# Patient Record
Sex: Female | Born: 1960
Health system: Southern US, Community
[De-identification: ages and names within clinical notes are randomized; demographics above are authoritative.]

---

## 1999-12-08 ENCOUNTER — Encounter: Payer: Self-pay | Admitting: Family Medicine

## 1999-12-08 ENCOUNTER — Encounter: Admission: RE | Admit: 1999-12-08 | Discharge: 1999-12-08 | Payer: Self-pay | Admitting: Family Medicine

## 2008-03-27 ENCOUNTER — Encounter: Admission: RE | Admit: 2008-03-27 | Discharge: 2008-03-27 | Payer: Self-pay | Admitting: Family Medicine

## 2019-07-27 ENCOUNTER — Other Ambulatory Visit: Payer: Self-pay

## 2019-07-27 DIAGNOSIS — Z20822 Contact with and (suspected) exposure to covid-19: Secondary | ICD-10-CM

## 2019-07-28 LAB — NOVEL CORONAVIRUS, NAA: SARS-CoV-2, NAA: NOT DETECTED

## 2019-11-22 ENCOUNTER — Ambulatory Visit: Payer: Self-pay

## 2019-11-26 ENCOUNTER — Ambulatory Visit: Payer: Self-pay | Attending: Family

## 2019-11-26 DIAGNOSIS — Z23 Encounter for immunization: Secondary | ICD-10-CM | POA: Insufficient documentation

## 2019-11-26 NOTE — Progress Notes (Signed)
   Covid-19 Vaccination [facility]  Name:  Misty Roth    MRN: [id number] DOB: April 07, 1961  11/26/2019  [name] was observed post Covid-19 immunization for 15 minutes without incidence. She was provided with Vaccine Information Sheet and instruction to access the V-Safe system.   [name] was instructed to call 911 with any severe reactions post vaccine: Marland Kitchen Difficulty breathing  . Swelling of your face and throat  . A fast heartbeat  . A bad rash all over your body  . Dizziness and weakness    Immunizations Administered    Name Date Dose VIS Date Route   Moderna COVID-19 Vaccine 11/26/2019  4:30 PM 0.5 mL 09/04/2019 Intramuscular   Manufacturer: Moderna   Lot: [id number]   NDC: 45848-350-75

## 2020-01-08 ENCOUNTER — Ambulatory Visit: Payer: Self-pay | Attending: Internal Medicine

## 2020-01-08 DIAGNOSIS — Z23 Encounter for immunization: Secondary | ICD-10-CM

## 2020-01-08 NOTE — Progress Notes (Signed)
   Covid-19 Vaccination Clinic  Name:  Misty Roth    MRN: [id number] DOB: July 31, 1961  01/08/2020  [name] was observed post Covid-19 immunization for 15 minutes without incident. She was provided with Vaccine Information Sheet and instruction to access the V-Safe system.   [name] was instructed to call 911 with any severe reactions post vaccine: Marland Kitchen Difficulty breathing  . Swelling of face and throat  . A fast heartbeat  . A bad rash all over body  . Dizziness and weakness   Immunizations Administered    Name Date Dose VIS Date Route   Moderna COVID-19 Vaccine 01/08/2020  3:21 PM 0.5 mL 09/04/2019 Intramuscular   Manufacturer: Moderna   Lot: [id number]   NDC: 30940-768-08

## 2020-12-08 ENCOUNTER — Other Ambulatory Visit: Payer: Self-pay | Admitting: Nurse Practitioner

## 2020-12-08 DIAGNOSIS — F17200 Nicotine dependence, unspecified, uncomplicated: Secondary | ICD-10-CM

## 2020-12-23 ENCOUNTER — Ambulatory Visit
Admission: RE | Admit: 2020-12-23 | Discharge: 2020-12-23 | Disposition: A | Discharge status: Home / Self Care | Payer: BC Managed Care – PPO | Source: Ambulatory Visit | Attending: Nurse Practitioner | Admitting: Nurse Practitioner

## 2020-12-23 ENCOUNTER — Other Ambulatory Visit: Payer: Self-pay

## 2020-12-23 DIAGNOSIS — F17200 Nicotine dependence, unspecified, uncomplicated: Secondary | ICD-10-CM

## 2021-04-30 IMAGING — CT CT CHEST LUNG CANCER SCREENING LOW DOSE W/O CM
1 series · 15 of 31 positions shown, 19 images · non-contrast
Comparison: None.

CLINICAL DATA: Former smoker, quit 7 years ago, 25 pack-year
history.

EXAM:
CT CHEST WITHOUT CONTRAST LOW-DOSE FOR LUNG CANCER SCREENING
TECHNIQUE: Multidetector CT imaging of the chest was performed following the
standard protocol without IV contrast.

[Series 2: ldct screen -soft · axial · 0.86mm/px · z∈[-334,-44]mm · 15 of 64 slices shown, 19 images]
[im 3/64  mediastinal]
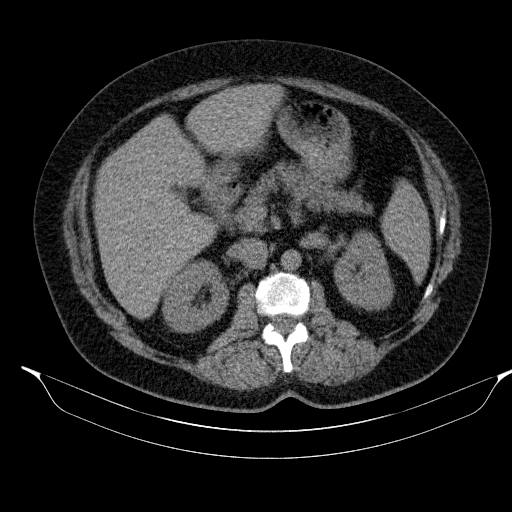
[im 3/64  lung]
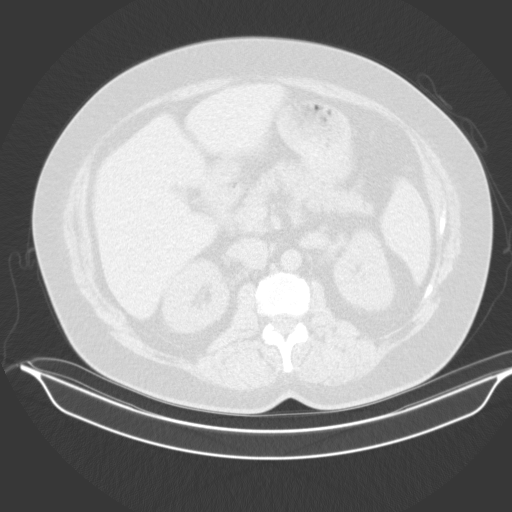
[im 8/64  lung]
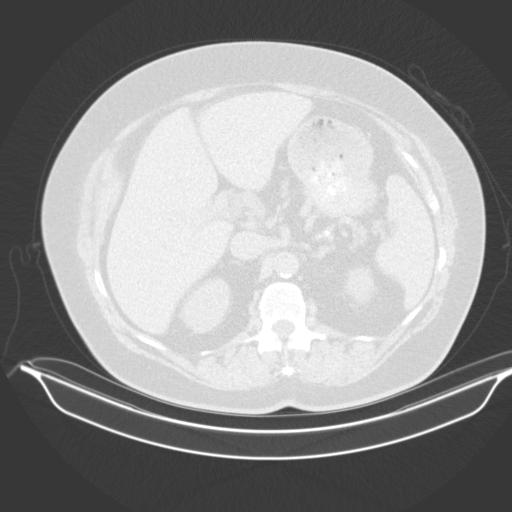
[im 12/64  lung]
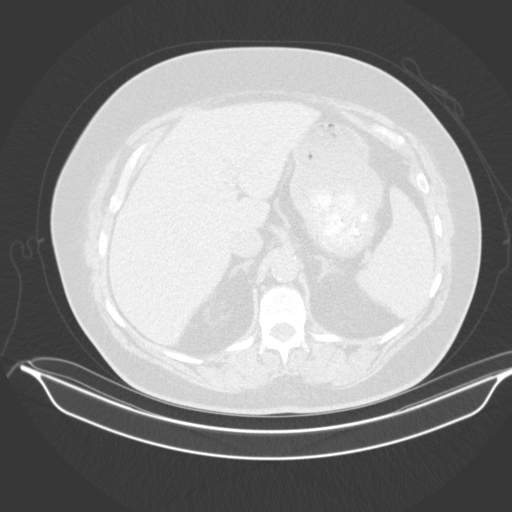
[im 15/64  lung]
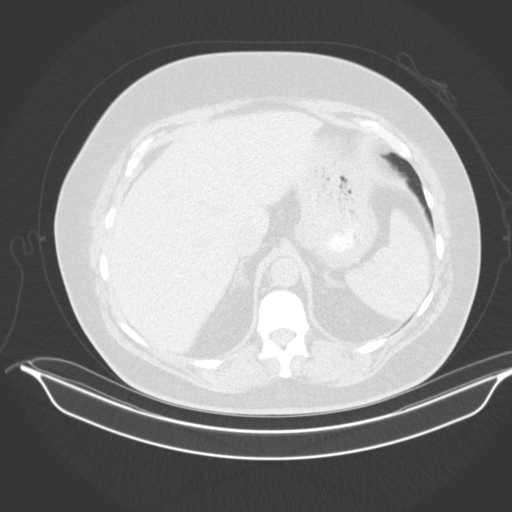
[im 19/64  mediastinal]
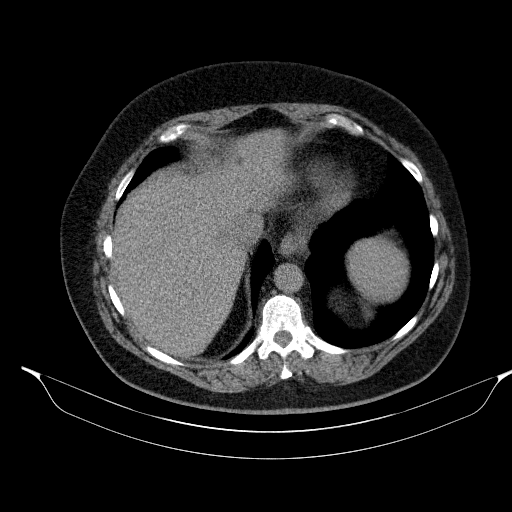
[im 19/64  lung]
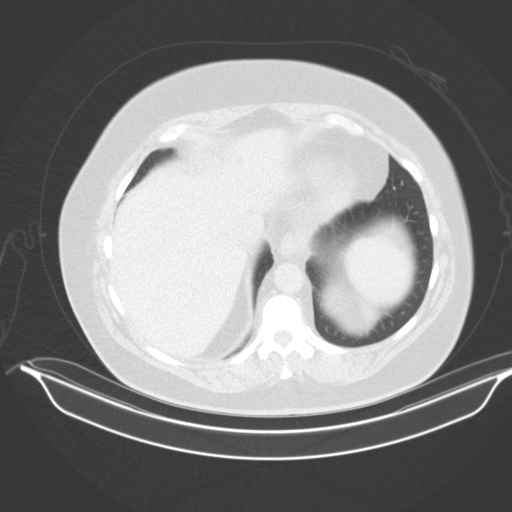
[im 24/64  lung]
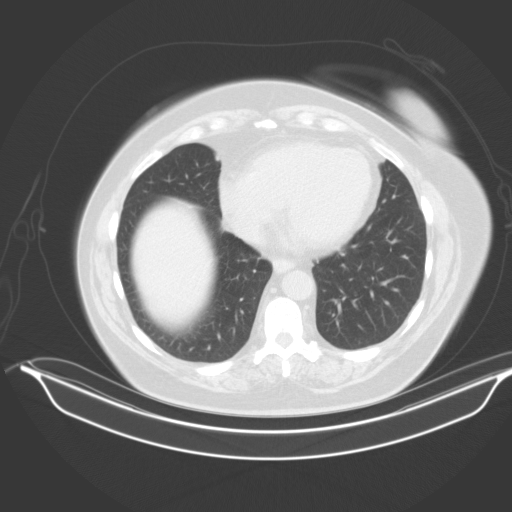
[im 29/64  lung]
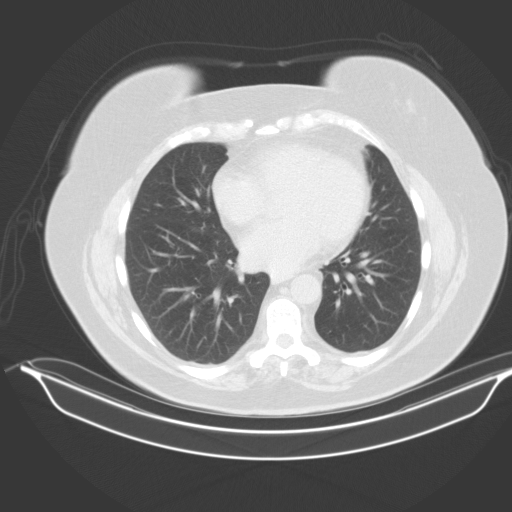
[im 33/64  lung]
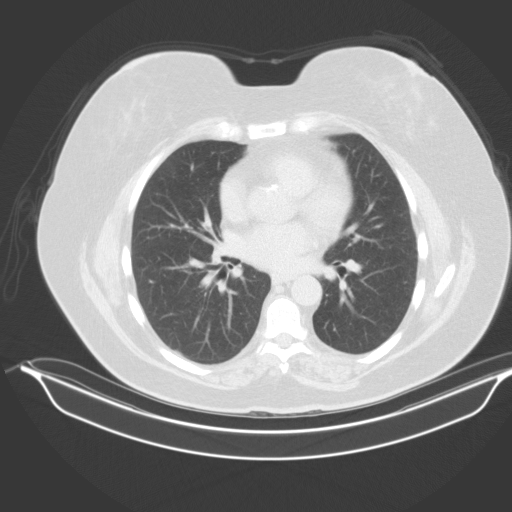
[im 36/64  mediastinal]
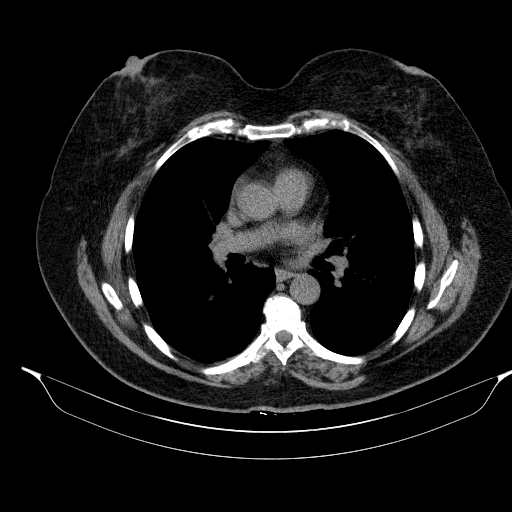
[im 36/64  lung]
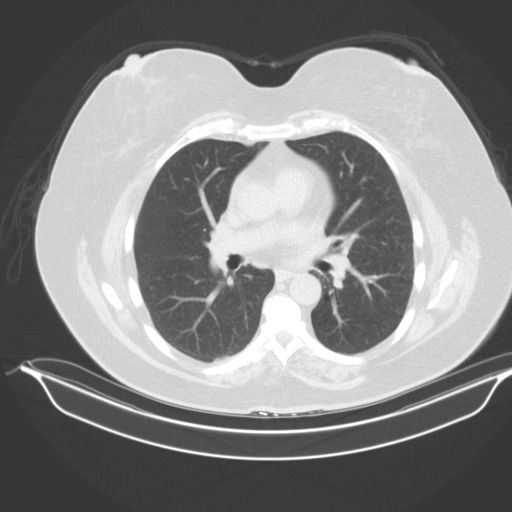
[im 40/64  lung]
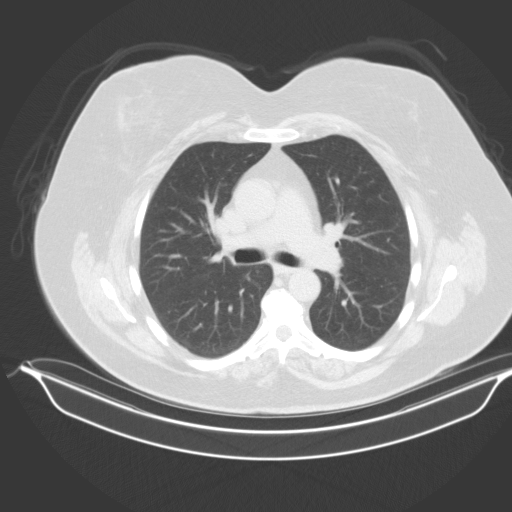
[im 45/64  lung]
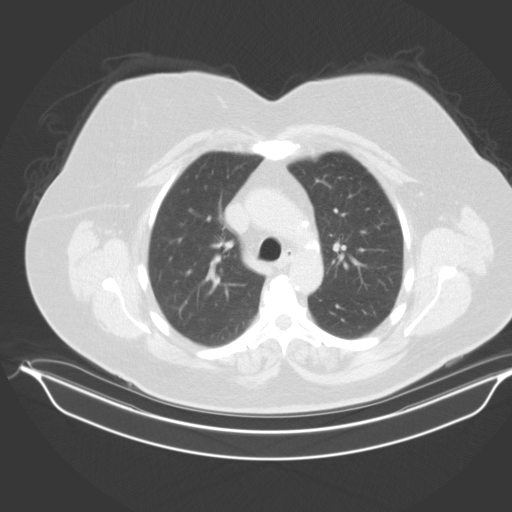
[im 50/64  lung]
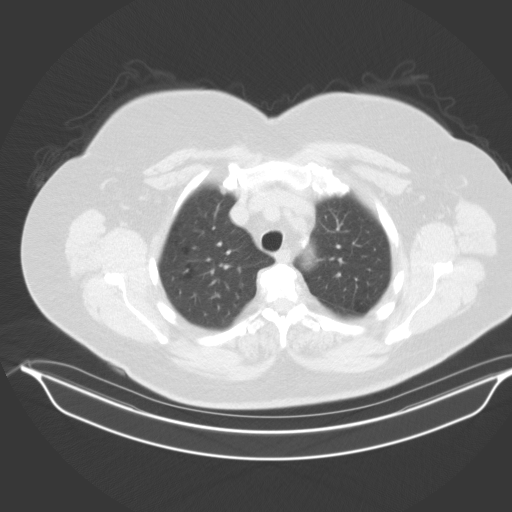
[im 52/64  mediastinal]
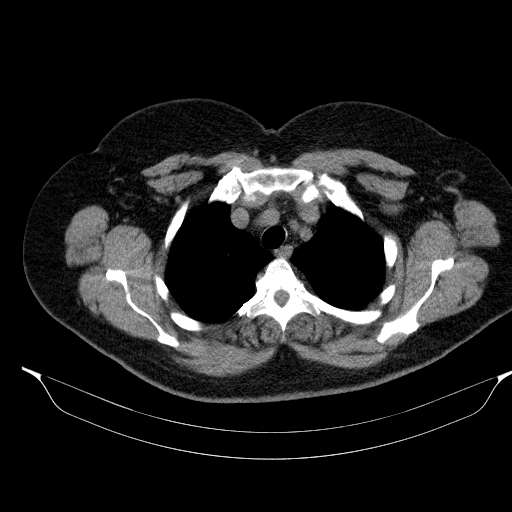
[im 52/64  lung]
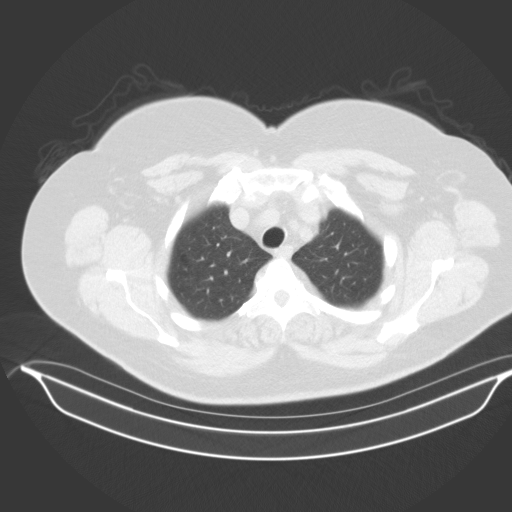
[im 57/64  lung]
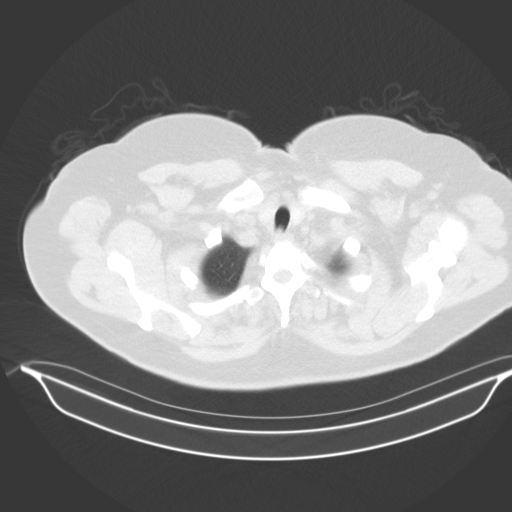
[im 61/64  lung]
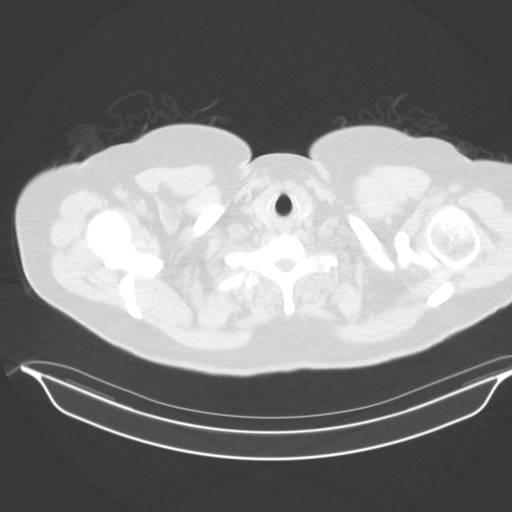

[15 of 31 positions shown; findings below may reference images not displayed]

FINDINGS: Cardiovascular: Atherosclerotic calcification of the aorta. Heart
size normal. No pericardial effusion.

Mediastinum/Nodes: Subcentimeter low-attenuation lesion in the right
thyroid. No follow-up recommended. (Ref: [HOSPITAL]. [DATE]): 143-50).No pathologically enlarged mediastinal or
axillary lymph nodes. Hilar regions are difficult to evaluate
without IV contrast but appear grossly unremarkable. Esophagus is
grossly unremarkable.

Lungs/Pleura: Residual smoking related respiratory bronchiolitis.
Centrilobular emphysema. Calcified left lower lobe granuloma. No
suspicious pulmonary nodules. No pleural fluid. Airway is
unremarkable.

Upper Abdomen: Subcentimeter low-attenuation lesion in the dome of
the left hepatic lobe is too small to characterize. Visualized
portions of the liver, gallbladder, adrenal glands, kidneys, spleen,
pancreas, stomach and bowel are otherwise unremarkable.

Musculoskeletal: Degenerative changes in the spine. No worrisome
lytic or sclerotic lesions.
IMPRESSION: 1. Lung-RADS 1, negative. Continue annual screening with low-dose
chest CT without contrast in 12 months.
2.  Aortic atherosclerosis ([id number]).
3.  Emphysema ([id number]).
